# Patient Record
Sex: Male | Born: 1990 | Race: Black or African American | Hispanic: No | Marital: Single | Smoking: Never smoker
Health system: Southern US, Community
[De-identification: ages and names within clinical notes are randomized; demographics above are authoritative.]

## PROBLEM LIST (undated history)

## (undated) DIAGNOSIS — R569 Unspecified convulsions: Secondary | ICD-10-CM

## (undated) HISTORY — DX: Unspecified convulsions: R56.9

---

## 2012-01-09 ENCOUNTER — Emergency Department (HOSPITAL_COMMUNITY): Payer: Managed Care, Other (non HMO)

## 2012-01-09 ENCOUNTER — Observation Stay (HOSPITAL_COMMUNITY)
Admission: EM | Admit: 2012-01-09 | Discharge: 2012-01-10 | Disposition: A | Discharge status: Home / Self Care | Payer: Managed Care, Other (non HMO) | Attending: General Surgery | Admitting: General Surgery

## 2012-01-09 DIAGNOSIS — W3400XA Accidental discharge from unspecified firearms or gun, initial encounter: Secondary | ICD-10-CM

## 2012-01-09 DIAGNOSIS — S0100XA Unspecified open wound of scalp, initial encounter: Principal | ICD-10-CM | POA: Insufficient documentation

## 2012-01-09 DIAGNOSIS — Y929 Unspecified place or not applicable: Secondary | ICD-10-CM | POA: Insufficient documentation

## 2012-01-09 LAB — POCT I-STAT, CHEM 8
BUN: 22 mg/dL (ref 6–23)
Calcium, Ion: 1.19 mmol/L (ref 1.12–1.23)
Chloride: 104 meq/L (ref 96–112)
Creatinine, Ser: 1.3 mg/dL (ref 0.50–1.35)
Glucose, Bld: 117 mg/dL — ABNORMAL HIGH (ref 70–99)
HCT: 44 % (ref 39.0–52.0)
Hemoglobin: 15 g/dL (ref 13.0–17.0)
Potassium: 3.8 meq/L (ref 3.5–5.1)
Sodium: 141 meq/L (ref 135–145)
TCO2: 24 mmol/L (ref 0–100)

## 2012-01-09 MED ORDER — DOCUSATE SODIUM 100 MG PO CAPS: 100.0000 mg | ORAL_CAPSULE | Freq: Two times a day (BID) | ORAL | Status: DC

## 2012-01-09 MED ORDER — ACETAMINOPHEN 325 MG PO TABS: 650.0000 mg | ORAL_TABLET | ORAL | Status: DC | PRN

## 2012-01-09 MED ORDER — ONDANSETRON HCL 4 MG/2ML IJ SOLN: 4.0000 mg | Freq: Four times a day (QID) | INTRAMUSCULAR | Status: DC | PRN

## 2012-01-09 MED ORDER — ONDANSETRON HCL 4 MG PO TABS
4.0000 mg | ORAL_TABLET | Freq: Four times a day (QID) | ORAL | Status: DC | PRN
  Filled 2012-01-09: qty 0.5

## 2012-01-09 MED ORDER — MORPHINE SULFATE 2 MG/ML IJ SOLN: 2.0000 mg | INTRAMUSCULAR | Status: DC | PRN

## 2012-01-09 MED ORDER — SODIUM CHLORIDE 0.9 % IV SOLN
INTRAVENOUS | Status: DC
  Administered 2012-01-09: 50 mL/h via INTRAVENOUS

## 2012-01-09 MED ORDER — HYDROCODONE-ACETAMINOPHEN 5-325 MG PO TABS: 1.0000 | ORAL_TABLET | ORAL | Status: DC | PRN

## 2012-01-09 MED ORDER — PANTOPRAZOLE SODIUM 40 MG PO TBEC: 40.0000 mg | DELAYED_RELEASE_TABLET | Freq: Every day | ORAL | Status: DC

## 2012-01-09 MED ORDER — PANTOPRAZOLE SODIUM 40 MG IV SOLR
40.0000 mg | Freq: Every day | INTRAVENOUS | Status: DC
  Filled 2012-01-09: qty 40
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
## 2012-01-09 NOTE — ED Notes (Signed)
Prior to Pt arrival EDP House, EDP Wickline, Trauma MD Berneice Heinrich RN, Thayer Ohm RN, Onalee Hua EMT, Resp Tech, Phlebotomy Tech, and x-ray tech in room
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
## 2012-01-09 NOTE — ED Provider Notes (Signed)
I have personally seen and examined the patient.  I have discussed the plan of care with the resident.  I have reviewed the documentation on PMH/FH/Soc. History.  I have reviewed the documentation of the resident and agree.   Joya Gaskins, MD 01/09/12 2358

## 2012-01-10 ENCOUNTER — Encounter (HOSPITAL_COMMUNITY): Payer: Self-pay | Admitting: General Practice

## 2012-01-10 LAB — TYPE AND SCREEN

## 2012-01-10 MED ORDER — ACETAMINOPHEN 325 MG PO TABS: 650.0000 mg | ORAL_TABLET | ORAL | Status: DC | PRN

## 2012-01-10 MED ORDER — HYDROCODONE-ACETAMINOPHEN 5-325 MG PO TABS: 1.0000 | ORAL_TABLET | Freq: Four times a day (QID) | ORAL | Status: DC | PRN
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
## 2012-01-10 NOTE — Discharge Instructions (Signed)
1.  May shower 2.  Neosporin and dry dressing to wound as needed. 3.  Follow up in 10 days for wound check or call with questions or concerns.

## 2012-01-10 NOTE — Discharge Summary (Signed)
Donald Kling, MD, MPH, FACS Pager: [phone]  
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
## 2012-01-21 ENCOUNTER — Encounter (INDEPENDENT_AMBULATORY_CARE_PROVIDER_SITE_OTHER): Payer: Self-pay

## 2012-01-21 ENCOUNTER — Ambulatory Visit (INDEPENDENT_AMBULATORY_CARE_PROVIDER_SITE_OTHER): Payer: Managed Care, Other (non HMO) | Admitting: Orthopedic Surgery

## 2012-01-21 VITALS — BP 108/66 | HR 72 | Temp 99.0°F | Resp 16 | Ht 71.0 in | Wt 177.5 lb

## 2012-01-21 DIAGNOSIS — S0190XA Unspecified open wound of unspecified part of head, initial encounter: Secondary | ICD-10-CM

## 2012-01-21 DIAGNOSIS — W3400XA Accidental discharge from unspecified firearms or gun, initial encounter: Secondary | ICD-10-CM

## 2012-01-21 DIAGNOSIS — S0193XA Puncture wound without foreign body of unspecified part of head, initial encounter: Secondary | ICD-10-CM | POA: Insufficient documentation
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
## 2013-09-18 ENCOUNTER — Emergency Department (HOSPITAL_COMMUNITY)
Admission: EM | Admit: 2013-09-18 | Discharge: 2013-09-18 | Disposition: A | Discharge status: Home / Self Care | Payer: Managed Care, Other (non HMO) | Attending: Emergency Medicine | Admitting: Emergency Medicine

## 2013-09-18 ENCOUNTER — Encounter (HOSPITAL_COMMUNITY): Payer: Self-pay | Admitting: Emergency Medicine

## 2013-09-18 DIAGNOSIS — S46819A Strain of other muscles, fascia and tendons at shoulder and upper arm level, unspecified arm, initial encounter: Principal | ICD-10-CM

## 2013-09-18 DIAGNOSIS — IMO0002 Reserved for concepts with insufficient information to code with codable children: Secondary | ICD-10-CM | POA: Insufficient documentation

## 2013-09-18 DIAGNOSIS — Y9389 Activity, other specified: Secondary | ICD-10-CM | POA: Insufficient documentation

## 2013-09-18 DIAGNOSIS — S43499A Other sprain of unspecified shoulder joint, initial encounter: Secondary | ICD-10-CM | POA: Insufficient documentation

## 2013-09-18 DIAGNOSIS — Y92410 Unspecified street and highway as the place of occurrence of the external cause: Secondary | ICD-10-CM | POA: Insufficient documentation

## 2013-09-18 DIAGNOSIS — S46812A Strain of other muscles, fascia and tendons at shoulder and upper arm level, left arm, initial encounter: Secondary | ICD-10-CM

## 2013-09-18 MED ORDER — NAPROXEN 500 MG PO TABS: 500.0000 mg | ORAL_TABLET | Freq: Two times a day (BID) | ORAL | Status: DC

## 2013-09-18 MED ORDER — CYCLOBENZAPRINE HCL 10 MG PO TABS: 10.0000 mg | ORAL_TABLET | Freq: Two times a day (BID) | ORAL | Status: DC | PRN

## 2013-09-18 NOTE — ED Notes (Signed)
Declined W/C at D/C and was escorted to lobby by RN. 

## 2013-09-18 NOTE — Discharge Instructions (Signed)
Take Flexeril as needed for muscle spasm. Take Naprosyn for pain. Refer to attached documents for more information. Apply heat to the affected areas.
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
## 2013-09-26 NOTE — ED Provider Notes (Signed)
Medical screening examination/treatment/procedure(s) were performed by non-physician practitioner and as supervising physician I was immediately available for consultation/collaboration.   EKG Interpretation None        Hrishikesh Hoeg, MD 09/26/13 0706 
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
## 2014-12-07 ENCOUNTER — Emergency Department (HOSPITAL_COMMUNITY)
Admission: EM | Admit: 2014-12-07 | Discharge: 2014-12-07 | Disposition: A | Discharge status: Home / Self Care | Payer: BLUE CROSS/BLUE SHIELD | Attending: Emergency Medicine | Admitting: Emergency Medicine

## 2014-12-07 ENCOUNTER — Encounter (HOSPITAL_COMMUNITY): Payer: Self-pay | Admitting: Family Medicine

## 2014-12-07 DIAGNOSIS — R519 Headache, unspecified: Secondary | ICD-10-CM

## 2014-12-07 DIAGNOSIS — R51 Headache: Secondary | ICD-10-CM | POA: Diagnosis present

## 2014-12-07 MED ORDER — DIPHENHYDRAMINE HCL 25 MG PO CAPS
50.0000 mg | ORAL_CAPSULE | Freq: Once | ORAL | Status: AC
  Administered 2014-12-07: 50 mg via ORAL
  Filled 2014-12-07: qty 2

## 2014-12-07 MED ORDER — METOCLOPRAMIDE HCL 10 MG PO TABS
5.0000 mg | ORAL_TABLET | Freq: Once | ORAL | Status: AC
  Administered 2014-12-07: 5 mg via ORAL
  Filled 2014-12-07: qty 1

## 2014-12-07 MED ORDER — DEXAMETHASONE 4 MG PO TABS
2.0000 mg | ORAL_TABLET | Freq: Once | ORAL | Status: AC
  Administered 2014-12-07: 2 mg via ORAL
  Filled 2014-12-07: qty 1

## 2014-12-07 MED ORDER — KETOROLAC TROMETHAMINE 60 MG/2ML IM SOLN
60.0000 mg | Freq: Once | INTRAMUSCULAR | Status: AC
  Administered 2014-12-07: 60 mg via INTRAMUSCULAR
  Filled 2014-12-07: qty 2
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
## 2014-12-07 NOTE — ED Notes (Signed)
PA at bedside.

## 2014-12-07 NOTE — Discharge Instructions (Signed)

## 2014-12-12 ENCOUNTER — Telehealth: Payer: Self-pay | Admitting: *Deleted

## 2014-12-12 NOTE — Telephone Encounter (Signed)
Pt called inquiring about Rx written for him on last visit 9/30.  NCM reviewed chart and did not find that Rx was written.  PT inquired about medications listed on his AVS; NCM advised that those were previous medications and not new Rx.  Pt verbalized understanding and appreciated explanation.

## 2014-12-13 ENCOUNTER — Encounter: Payer: Self-pay | Admitting: Neurology

## 2014-12-13 ENCOUNTER — Ambulatory Visit (INDEPENDENT_AMBULATORY_CARE_PROVIDER_SITE_OTHER): Payer: BLUE CROSS/BLUE SHIELD | Admitting: Neurology

## 2014-12-13 VITALS — BP 114/67 | HR 61 | Ht 71.0 in | Wt 185.6 lb

## 2014-12-13 DIAGNOSIS — R55 Syncope and collapse: Secondary | ICD-10-CM

## 2014-12-13 DIAGNOSIS — G44229 Chronic tension-type headache, not intractable: Secondary | ICD-10-CM

## 2014-12-13 DIAGNOSIS — R200 Anesthesia of skin: Secondary | ICD-10-CM

## 2014-12-13 NOTE — Patient Instructions (Signed)
-   Will do MRI with and without contrast  - will do EEG  - do not drive if you do not feel well  - follow up with PCP - follow up in one month

## 2014-12-14 DIAGNOSIS — R200 Anesthesia of skin: Secondary | ICD-10-CM | POA: Insufficient documentation

## 2014-12-14 DIAGNOSIS — G44229 Chronic tension-type headache, not intractable: Secondary | ICD-10-CM | POA: Insufficient documentation
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
## 2014-12-20 ENCOUNTER — Telehealth: Payer: Self-pay | Admitting: Neurology

## 2014-12-20 ENCOUNTER — Ambulatory Visit (INDEPENDENT_AMBULATORY_CARE_PROVIDER_SITE_OTHER): Payer: BLUE CROSS/BLUE SHIELD

## 2014-12-20 DIAGNOSIS — R55 Syncope and collapse: Secondary | ICD-10-CM

## 2014-12-20 NOTE — Telephone Encounter (Signed)
Patent wants to hold of on the MRI  BRAIN W/WO for now. He would like to have his EEG results first and needs to discuss with his parents on how he will pay for his copay of $1,433.48. Patient will call back to schedule if he decides to move forward with MRI.

## 2014-12-20 NOTE — Procedures (Signed)
    History:  Donald [name] is a 24 year old patient with the episode of syncope, associated with a fall in July 2016. The patient bit his lower lip with the fall, had some numbness of the tip of the tongue. There was some associated confusion. The patient is being evaluated for this event.  This is a routine EEG. No skull defects are noted. No medications are listed.   EEG classification: Normal awake and drowsy  Description of the recording: The background rhythms of this recording consists of a fairly well modulated medium amplitude alpha rhythm of 10 Hz that is reactive to eye opening and closure. As the record progresses, the patient appears to remain in the waking state throughout the recording. Photic stimulation was performed, resulting in a bilateral and symmetric photic driving response. Hyperventilation was also performed, resulting in a minimal buildup of the background rhythm activities without significant slowing seen. Toward the end of the recording, the patient enters the drowsy state with slight symmetric slowing seen. The patient never enters stage II sleep. At no time during the recording does there appear to be evidence of spike or spike wave discharges or evidence of focal slowing. EKG monitor shows no evidence of cardiac rhythm abnormalities with a heart rate of 60.  Impression: This is a normal EEG recording in the waking and drowsy state. No evidence of ictal or interictal discharges are seen.

## 2014-12-20 NOTE — Telephone Encounter (Signed)
It is OK with me. Please let the pt know that the MRI order has been cancelled. Thanks.  Donald PlanJindong Delmos Velaquez, MD PhD Stroke Neurology 12/20/2014 3:47 PM

## 2014-12-20 NOTE — Addendum Note (Signed)
Addended by: Marvel PlanXU, Pyper Olexa on: 12/20/2014 03:48 PM   Modules accepted: Orders

## 2014-12-21 ENCOUNTER — Telehealth: Payer: Self-pay | Admitting: *Deleted

## 2014-12-21 NOTE — Telephone Encounter (Signed)
I have spoken with Donald [name] this morning and per Dr. Roda ShuttersXu, advised that EEG was normal.  He verbalized understanding of same, sts. he does not want to sched. mri at this time--may call back next week./fim

## 2014-12-21 NOTE — Telephone Encounter (Signed)
LMOM (identified vm) that per Dr. Roda ShuttersXu, EEG is normal; he should continue current tx plan, and let us know if he would like to proceed with mri brain.  He does not need to return this call unless he has questions/fim

## 2014-12-21 NOTE — Telephone Encounter (Signed)
-----   Message from Marvel PlanJindong Xu, MD sent at 12/20/2014  5:45 PM EDT ----- Could you please let the pt know that his EEG done in our office today was normal. Continue current treatment plan. If he would like to go ahead with MRI brain, please let us know. Thanks.  Marvel PlanJindong Xu, MD PhD Stroke Neurology 12/20/2014 5:45 PM

## 2014-12-21 NOTE — Telephone Encounter (Signed)
Pt called returning Donald [name]'s call °

## 2014-12-24 ENCOUNTER — Telehealth: Payer: Self-pay | Admitting: Neurology

## 2014-12-24 DIAGNOSIS — R569 Unspecified convulsions: Secondary | ICD-10-CM

## 2014-12-24 DIAGNOSIS — R55 Syncope and collapse: Secondary | ICD-10-CM

## 2014-12-24 NOTE — Telephone Encounter (Signed)
Patient called stating that he has a seizure Sunday morning and [facility] since he was in [place]. Patient would like to have his MRI that he declined last week. Please reorder the MRI and I will call patient to schedule.

## 2014-12-25 DIAGNOSIS — R569 Unspecified convulsions: Secondary | ICD-10-CM | POA: Insufficient documentation

## 2014-12-25 NOTE — Telephone Encounter (Signed)
Talked with pt and his girlfriend on the phone. His girlfriend stated that last Sunday patient was sleeping, started to have shaking jerking episode, arm and leg stretched out stiff and shaking, with most forming, no tongue biting, bowel bladder incontinence, lasting 10-12 minutes. After that, he had confusion, not able to talk, for about 5 minutes and then back to baseline. EMS was called, patient was sent to nearby hospital in [place]. Head CAT scan, and put on Keppra 500 twice a day. He was asked to follow up with neurology for seizure management.  I told patient that we can order MRI with and without contrast, keep Keppra 500 twice a day, and no driving unless seizure free for 6 months. He expressed understanding and appreciation. He has appointment with me 01/25/2015.  Marvel PlanJindong Heidi Lemay, MD PhD Stroke Neurology 12/25/2014 5:39 PM  Orders Placed This Encounter  Procedures  . MR Brain W Wo Contrast    Standing Status: Future     Number of Occurrences:      Standing Expiration Date: 02/26/2016    Order Specific Question:  Reason for Exam (SYMPTOM  OR DIAGNOSIS REQUIRED)    Answer:  seizure    Order Specific Question:  Preferred imaging location?    Answer:  Internal    Order Specific Question:  Does the patient have a pacemaker or implanted devices?    Answer:  No    Order Specific Question:  What is the patient's sedation requirement?    Answer:  No Sedation

## 2015-01-22 ENCOUNTER — Encounter (INDEPENDENT_AMBULATORY_CARE_PROVIDER_SITE_OTHER): Payer: Self-pay

## 2015-01-22 ENCOUNTER — Encounter: Payer: Self-pay | Admitting: Neurology

## 2015-01-22 ENCOUNTER — Ambulatory Visit (INDEPENDENT_AMBULATORY_CARE_PROVIDER_SITE_OTHER): Payer: BLUE CROSS/BLUE SHIELD | Admitting: Neurology

## 2015-01-22 VITALS — BP 122/65 | HR 60 | Ht 71.0 in | Wt 181.8 lb

## 2015-01-22 DIAGNOSIS — R569 Unspecified convulsions: Secondary | ICD-10-CM | POA: Diagnosis not present

## 2015-01-22 MED ORDER — LEVETIRACETAM 500 MG PO TABS: 500.0000 mg | ORAL_TABLET | Freq: Two times a day (BID) | ORAL | Status: DC
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
## 2015-01-22 NOTE — Patient Instructions (Addendum)
-   will do MRI with and without contrast  - continue the keppra 500mg  twice a day and I have refilled your meds - According to [place] law, you can not drive until seizure free for 6 months and under physician's care.  - Please maintain seizure precautions. Do not participate in activities where a loss of awareness could hurt you or someone else.  No swimming alone, no tub bathing, no hot tubs, no driving, no operating motorized vehicles(cars, ATVs, motorbikes, etc), lawnmowers or power tools.  No standing at heights, such as rooftops, ladders or stairs. No sliding boards, monkey bars or swings, or climbing trees.  Avoid hot objects such as stoves, heaters, open fires.  Wear a helmet when riding a bicycle, scooter, skateboard, etc. and avoid areas of traffic.  Set water heater to 120 degrees or less. - follow up with PCP - follow up in 3 month

## 2015-02-13 ENCOUNTER — Ambulatory Visit (INDEPENDENT_AMBULATORY_CARE_PROVIDER_SITE_OTHER): Payer: BLUE CROSS/BLUE SHIELD

## 2015-02-13 DIAGNOSIS — R569 Unspecified convulsions: Secondary | ICD-10-CM

## 2015-02-13 MED ORDER — GADOPENTETATE DIMEGLUMINE 469.01 MG/ML IV SOLN: 18.0000 mL | Freq: Once | INTRAVENOUS | Status: AC | PRN

## 2015-02-18 ENCOUNTER — Telehealth: Payer: Self-pay

## 2015-02-18 NOTE — Telephone Encounter (Signed)
Rn call patient about his MRi results. Rn explain that the MRi brain was normal and no abnormalities. Continue the treatment. Pt verbalized understanding. Rn explain to continue taking medication per Dr Roda ShuttersXu. Pt stated he will continue taking medication as prescribed.

## 2015-02-18 NOTE — Telephone Encounter (Signed)
-----   Message from Marvel PlanJindong Xu, MD sent at 02/15/2015  4:46 PM EST ----- Hi, Katrina:  Could you please let the patient know that MRI brain done recently was normal brain MRI. No abnormalities found. Please continue current treatment. Thanks.  Marvel PlanJindong Xu, MD PhD Stroke Neurology 02/15/2015 4:46 PM

## 2015-04-15 ENCOUNTER — Ambulatory Visit (INDEPENDENT_AMBULATORY_CARE_PROVIDER_SITE_OTHER): Payer: BLUE CROSS/BLUE SHIELD | Admitting: Neurology

## 2015-04-15 ENCOUNTER — Encounter: Payer: Self-pay | Admitting: Neurology

## 2015-04-15 VITALS — BP 110/66 | HR 52 | Ht 71.0 in | Wt 181.6 lb

## 2015-04-15 DIAGNOSIS — R569 Unspecified convulsions: Secondary | ICD-10-CM | POA: Diagnosis not present
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
## 2015-04-15 NOTE — Patient Instructions (Signed)
-   continue keppra  twice a day - - According to [place] law, you can not drive until seizure free for 6 months and under physician's care.  - Please maintain seizure precautions. Do not participate in activities where a loss of awareness could hurt you or someone else.  No swimming alone, no tub bathing, no hot tubs, no driving, no operating motorized vehicles(cars, ATVs, motorbikes, etc), lawnmowers or power tools.  No standing at heights, such as rooftops, ladders or stairs. No sliding boards, monkey bars or swings, or climbing trees.  Avoid hot objects such as stoves, heaters, open fires.  Wear a helmet when riding a bicycle, scooter, skateboard, etc. and avoid areas of traffic.  Set water heater to 120 degrees or less. - follow up in 2-3 month for driving privileges.

## 2015-04-16 ENCOUNTER — Ambulatory Visit: Payer: BLUE CROSS/BLUE SHIELD | Admitting: Neurology

## 2015-07-11 ENCOUNTER — Ambulatory Visit (INDEPENDENT_AMBULATORY_CARE_PROVIDER_SITE_OTHER): Payer: BLUE CROSS/BLUE SHIELD | Admitting: Neurology

## 2015-07-11 ENCOUNTER — Encounter: Payer: Self-pay | Admitting: Neurology

## 2015-07-11 VITALS — BP 121/69 | HR 63 | Ht 71.0 in | Wt 182.8 lb

## 2015-07-11 DIAGNOSIS — R569 Unspecified convulsions: Secondary | ICD-10-CM
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
## 2015-07-11 NOTE — Patient Instructions (Signed)
-   continue keppra 500mg  twice a day - able to drive now since you have been 6 months free of seizure - if there is seizure again, there will be again no driving until 6 months seizure free - if you like to be off seizure medications, there will be again 6 months period of no driving after off the medication  - Please continue to maintain seizure precautions. - follow up in 6 months

## 2015-07-22 ENCOUNTER — Ambulatory Visit: Payer: BLUE CROSS/BLUE SHIELD | Admitting: Neurology

## 2016-01-06 ENCOUNTER — Encounter (HOSPITAL_COMMUNITY): Payer: Self-pay | Admitting: Emergency Medicine

## 2016-01-06 DIAGNOSIS — R569 Unspecified convulsions: Secondary | ICD-10-CM | POA: Diagnosis present

## 2016-01-06 LAB — COMPREHENSIVE METABOLIC PANEL
ALBUMIN: 4.3 g/dL (ref 3.5–5.0)
ALT: 13 U/L — ABNORMAL LOW (ref 17–63)
ANION GAP: 13 (ref 5–15)
AST: 22 U/L (ref 15–41)
Alkaline Phosphatase: 54 U/L (ref 38–126)
BUN: 21 mg/dL — ABNORMAL HIGH (ref 6–20)
CHLORIDE: 104 mmol/L (ref 101–111)
CO2: 20 mmol/L — AB (ref 22–32)
Calcium: 9.6 mg/dL (ref 8.9–10.3)
Creatinine, Ser: 1.38 mg/dL — ABNORMAL HIGH (ref 0.61–1.24)
GFR calc Af Amer: >60 mL/min (ref >60)
GFR calc non Af Amer: >60 mL/min (ref >60)
GLUCOSE: 100 mg/dL — AB (ref 65–99)
POTASSIUM: 3.6 mmol/L (ref 3.5–5.1)
SODIUM: 137 mmol/L (ref 135–145)
TOTAL PROTEIN: 7.2 g/dL (ref 6.5–8.1)
Total Bilirubin: 0.3 mg/dL (ref 0.3–1.2)

## 2016-01-06 LAB — CBC WITH DIFFERENTIAL/PLATELET
BASOS ABS: 0 10*3/uL (ref 0.0–0.1)
Basophils Relative: 1 %
Eosinophils Absolute: 0.2 10*3/uL (ref 0.0–0.7)
Eosinophils Relative: 5 %
HCT: 39.9 % (ref 39.0–52.0)
Hemoglobin: 13.4 g/dL (ref 13.0–17.0)
LYMPHS ABS: 2.8 10*3/uL (ref 0.7–4.0)
Lymphocytes Relative: 60 %
MCH: 22.7 pg — ABNORMAL LOW (ref 26.0–34.0)
MCHC: 33.6 g/dL (ref 30.0–36.0)
MCV: 67.6 fL — ABNORMAL LOW (ref 78.0–100.0)
MONO ABS: 0.3 10*3/uL (ref 0.1–1.0)
Monocytes Relative: 7 %
NEUTROS PCT: 27 %
Neutro Abs: 1.2 10*3/uL — ABNORMAL LOW (ref 1.7–7.7)
PLATELETS: 232 10*3/uL (ref 150–400)
RBC: 5.9 MIL/uL — AB (ref 4.22–5.81)
RDW: 13.2 % (ref 11.5–15.5)
WBC: 4.5 10*3/uL (ref 4.0–10.5)

## 2016-01-06 LAB — URINALYSIS, ROUTINE W REFLEX MICROSCOPIC
Bilirubin Urine: NEGATIVE
GLUCOSE, UA: NEGATIVE mg/dL
Ketones, ur: NEGATIVE mg/dL
LEUKOCYTES UA: NEGATIVE
Nitrite: NEGATIVE
PH: 5.5 (ref 5.0–8.0)
PROTEIN: NEGATIVE mg/dL
SPECIFIC GRAVITY, URINE: 1.016 (ref 1.005–1.030)

## 2016-01-06 LAB — URINE MICROSCOPIC-ADD ON: WBC, UA: NONE SEEN WBC/hpf (ref 0–5)

## 2016-01-06 NOTE — ED Triage Notes (Signed)
Pt. reports seizure episode x1 this evening at home , denies injury , respirations unlabored /alert and oriented at arrival . He takes Keppra twice daily .

## 2016-01-07 ENCOUNTER — Emergency Department (HOSPITAL_COMMUNITY)
Admission: EM | Admit: 2016-01-07 | Discharge: 2016-01-07 | Disposition: A | Discharge status: Home / Self Care | Payer: BLUE CROSS/BLUE SHIELD | Attending: Emergency Medicine | Admitting: Emergency Medicine

## 2016-01-07 ENCOUNTER — Telehealth: Payer: Self-pay

## 2016-01-07 DIAGNOSIS — R569 Unspecified convulsions: Secondary | ICD-10-CM
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
## 2016-01-07 NOTE — Telephone Encounter (Signed)
LFt vm for patient to schedule appt sooner than 01/20/2016. Rn needs to be seen this week for another possible seizure.

## 2016-01-08 NOTE — Telephone Encounter (Signed)
Pt was r/s for 11/3 at 9am.

## 2016-01-10 ENCOUNTER — Encounter: Payer: Self-pay | Admitting: Neurology

## 2016-01-10 ENCOUNTER — Ambulatory Visit (INDEPENDENT_AMBULATORY_CARE_PROVIDER_SITE_OTHER): Payer: BLUE CROSS/BLUE SHIELD | Admitting: Neurology

## 2016-01-10 VITALS — BP 112/67 | HR 51 | Ht 71.0 in | Wt 180.2 lb

## 2016-01-10 DIAGNOSIS — R569 Unspecified convulsions: Secondary | ICD-10-CM | POA: Diagnosis not present

## 2016-01-10 MED ORDER — LACOSAMIDE 200 MG PO TABS: ORAL_TABLET | ORAL | 5 refills | Status: DC
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
## 2016-01-10 NOTE — Patient Instructions (Signed)
-   will start vimpat 100mg  twice a day for 7 days and then 200mg  twice a day.  - continue keppra 500mg  twice a day for 7 days and then off  - According to [place] law, you can not drive until seizure free for 6 months and under physician's care.  - Please maintain seizure precautions. Do not participate in activities where a loss of awareness could hurt you or someone else.  No swimming alone, no tub bathing, no hot tubs, no driving, no operating motorized vehicles(cars, ATVs, motorbikes, etc), lawnmowers or power tools.  No standing at heights, such as rooftops, ladders or stairs. No sliding boards, monkey bars or swings, or climbing trees.  Avoid hot objects such as stoves, heaters, open fires.  Wear a helmet when riding a bicycle, scooter, skateboard, etc. and avoid areas of traffic.  Set water heater to 120 degrees or less. - if seizure again, there will be again no driving until 6 months seizure free - follow up in 2 months

## 2016-01-20 ENCOUNTER — Ambulatory Visit: Payer: BLUE CROSS/BLUE SHIELD | Admitting: Neurology

## 2016-01-21 ENCOUNTER — Telehealth: Payer: Self-pay

## 2016-01-21 NOTE — Telephone Encounter (Signed)
Rn call patient back about his vimpat. Rn ask pt if he had any issues getting the vimpat. Pt stated he was able to get the vimpat at a 20.00 co payment. Pt has the medication now. Pt was unable to get the medication because of some transportation issues. He is taking the keppra and vimpat per Dr.Xu notes. He will taper off the keppra and continue the vimpat by himself.

## 2016-03-31 ENCOUNTER — Ambulatory Visit: Payer: BLUE CROSS/BLUE SHIELD | Admitting: Neurology

## 2016-04-01 ENCOUNTER — Ambulatory Visit (INDEPENDENT_AMBULATORY_CARE_PROVIDER_SITE_OTHER): Payer: BLUE CROSS/BLUE SHIELD | Admitting: Neurology

## 2016-04-01 ENCOUNTER — Encounter: Payer: Self-pay | Admitting: Neurology

## 2016-04-01 VITALS — BP 111/66 | HR 69 | Ht 71.0 in | Wt 175.4 lb

## 2016-04-01 DIAGNOSIS — R569 Unspecified convulsions: Secondary | ICD-10-CM

## 2016-04-01 DIAGNOSIS — S0190XS Unspecified open wound of unspecified part of head, sequela: Secondary | ICD-10-CM

## 2016-04-01 DIAGNOSIS — S0193XS Puncture wound without foreign body of unspecified part of head, sequela: Secondary | ICD-10-CM

## 2016-04-01 DIAGNOSIS — W3400XS Accidental discharge from unspecified firearms or gun, sequela: Secondary | ICD-10-CM

## 2016-04-01 NOTE — Patient Instructions (Signed)
-   continue vimpat 200mg  twice a day.  - find a neurologist in [place] and continue follow up for seizure - According to [employer] law, you can not drive until seizure free for 6 months and under physician's care.  - Please maintain seizure precautions. Do not participate in activities where a loss of awareness could hurt you or someone else.  No swimming alone, no tub bathing, no hot tubs, no driving, no operating motorized vehicles(cars, ATVs, motorbikes, etc), lawnmowers or power tools.  No standing at heights, such as rooftops, ladders or stairs. No sliding boards, monkey bars or swings, or climbing trees.  Avoid hot objects such as stoves, heaters, open fires.  Wear a helmet when riding a bicycle, scooter, skateboard, etc. and avoid areas of traffic.  Set water heater to 120 degrees or less. - follow up as needed.
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
## 2016-07-22 ENCOUNTER — Other Ambulatory Visit: Payer: Self-pay | Admitting: Neurology

## 2016-07-23 ENCOUNTER — Other Ambulatory Visit: Payer: Self-pay | Admitting: Neurology

## 2016-07-23 ENCOUNTER — Other Ambulatory Visit: Payer: Self-pay

## 2016-07-23 ENCOUNTER — Telehealth: Payer: Self-pay

## 2016-07-23 MED ORDER — LACOSAMIDE 200 MG PO TABS: ORAL_TABLET | ORAL | 0 refills | Status: DC

## 2016-07-23 NOTE — Telephone Encounter (Signed)
Yes, please refill his meds until he finds a neurologist in [place]. Thanks.   Marvel PlanJindong Emmalie Haigh, MD PhD Stroke Neurology 07/23/2016 3:59 PM

## 2016-07-23 NOTE — Telephone Encounter (Signed)
Rn call [place] if [place] and is seeing a neurologist. Pt stated he did move to [place],but does not have a neurologist. PT stated " I thought your office was going to find me one". Rn explain at last visit he was told to look around in [place] for a neurologist who mangages seizures to take over his care. Pt stated he misunderstood. Rn recommend he find a neurologist and records can be fax to the office. A referral can be made by Dr.Xu or his PCP. Rn stated a refill can be done per Dr. Roda ShuttersXu until he finds a new neurologist. Pt stated he will call around today and let us know.

## 2016-07-23 NOTE — Telephone Encounter (Signed)
Refill done until pt finds a neurologist in [place].

## 2016-07-23 NOTE — Telephone Encounter (Signed)
RX fax to pharmacy [facility] in [place].

## 2016-09-01 ENCOUNTER — Other Ambulatory Visit: Payer: Self-pay

## 2016-09-01 MED ORDER — LACOSAMIDE 200 MG PO TABS: ORAL_TABLET | ORAL | 3 refills | Status: AC
